# Patient Record
Sex: Male | Born: 1960 | Race: Black or African American | Hispanic: No | Marital: Married | Smoking: Current some day smoker
Health system: Southern US, Community
[De-identification: ages and names within clinical notes are randomized; demographics above are authoritative.]

## PROBLEM LIST (undated history)

## (undated) DIAGNOSIS — I10 Essential (primary) hypertension: Secondary | ICD-10-CM

## (undated) DIAGNOSIS — M109 Gout, unspecified: Secondary | ICD-10-CM

## (undated) HISTORY — PX: SHOULDER SURGERY: SHX246

## (undated) HISTORY — PX: CARPAL TUNNEL RELEASE: SHX101

---

## 1998-06-11 ENCOUNTER — Emergency Department (HOSPITAL_COMMUNITY): Admission: EM | Admit: 1998-06-11 | Discharge: 1998-06-11 | Payer: Self-pay | Admitting: Emergency Medicine

## 2000-09-01 ENCOUNTER — Encounter: Payer: Self-pay | Admitting: Family Medicine

## 2000-09-01 ENCOUNTER — Ambulatory Visit (HOSPITAL_COMMUNITY): Admission: RE | Admit: 2000-09-01 | Discharge: 2000-09-01 | Payer: Self-pay | Admitting: Family Medicine

## 2000-09-07 ENCOUNTER — Ambulatory Visit: Admission: RE | Admit: 2000-09-07 | Discharge: 2000-09-07 | Payer: Self-pay | Admitting: Family Medicine

## 2000-12-18 ENCOUNTER — Emergency Department (HOSPITAL_COMMUNITY): Admission: EM | Admit: 2000-12-18 | Discharge: 2000-12-18 | Payer: Self-pay | Admitting: Emergency Medicine

## 2000-12-18 ENCOUNTER — Encounter: Payer: Self-pay | Admitting: Emergency Medicine

## 2003-11-05 ENCOUNTER — Ambulatory Visit: Payer: Self-pay | Admitting: Family Medicine

## 2003-11-13 ENCOUNTER — Ambulatory Visit: Payer: Self-pay | Admitting: Family Medicine

## 2003-12-13 ENCOUNTER — Ambulatory Visit: Payer: Self-pay | Admitting: Family Medicine

## 2005-06-08 ENCOUNTER — Emergency Department (HOSPITAL_COMMUNITY): Admission: EM | Admit: 2005-06-08 | Discharge: 2005-06-08 | Payer: Self-pay | Admitting: Emergency Medicine

## 2006-02-02 ENCOUNTER — Emergency Department (HOSPITAL_COMMUNITY): Admission: EM | Admit: 2006-02-02 | Discharge: 2006-02-02 | Payer: Self-pay | Admitting: Emergency Medicine

## 2008-07-05 ENCOUNTER — Emergency Department (HOSPITAL_COMMUNITY): Admission: EM | Admit: 2008-07-05 | Discharge: 2008-07-05 | Payer: Self-pay | Admitting: Emergency Medicine

## 2009-05-24 ENCOUNTER — Ambulatory Visit: Payer: Self-pay

## 2011-10-20 ENCOUNTER — Ambulatory Visit: Payer: Self-pay | Admitting: Unknown Physician Specialty

## 2011-10-21 LAB — PATHOLOGY REPORT

## 2012-11-14 ENCOUNTER — Ambulatory Visit: Payer: Self-pay | Admitting: Podiatry

## 2014-04-15 ENCOUNTER — Emergency Department
Admit: 2014-04-15 | Disposition: A | Discharge status: Home / Self Care | Payer: Self-pay | Admitting: Emergency Medicine

## 2014-04-15 LAB — CBC
HCT: 41.4 % (ref 40.0–52.0)
HGB: 13.9 g/dL (ref 13.0–18.0)
MCH: 32.2 pg (ref 26.0–34.0)
MCHC: 33.5 g/dL (ref 32.0–36.0)
MCV: 96 fL (ref 80–100)
Platelet: 209 10*3/uL (ref 150–440)
RBC: 4.31 10*6/uL — ABNORMAL LOW (ref 4.40–5.90)
RDW: 13.5 % (ref 11.5–14.5)
WBC: 7.9 10*3/uL (ref 3.8–10.6)

## 2014-04-15 LAB — BASIC METABOLIC PANEL
Anion Gap: 2 — ABNORMAL LOW (ref 7–16)
BUN: 17 mg/dL
CALCIUM: 8.9 mg/dL
Chloride: 104 mmol/L
Co2: 31 mmol/L
Creatinine: 1.25 mg/dL — ABNORMAL HIGH
EGFR (Non-African Amer.): >60
Glucose: 152 mg/dL — ABNORMAL HIGH
POTASSIUM: 3.4 mmol/L — AB
Sodium: 137 mmol/L

## 2014-04-15 LAB — TROPONIN I: Troponin-I: <0.03 ng/mL

## 2014-07-16 ENCOUNTER — Encounter: Payer: Self-pay | Admitting: Emergency Medicine

## 2014-07-16 ENCOUNTER — Emergency Department
Admission: EM | Admit: 2014-07-16 | Discharge: 2014-07-16 | Disposition: A | Discharge status: Home / Self Care | Payer: BLUE CROSS/BLUE SHIELD | Attending: Emergency Medicine | Admitting: Emergency Medicine

## 2014-07-16 DIAGNOSIS — R319 Hematuria, unspecified: Secondary | ICD-10-CM | POA: Diagnosis present

## 2014-07-16 DIAGNOSIS — Z720 Tobacco use: Secondary | ICD-10-CM | POA: Insufficient documentation

## 2014-07-16 DIAGNOSIS — N390 Urinary tract infection, site not specified: Secondary | ICD-10-CM | POA: Diagnosis not present

## 2014-07-16 DIAGNOSIS — I10 Essential (primary) hypertension: Secondary | ICD-10-CM | POA: Insufficient documentation

## 2014-07-16 HISTORY — DX: Essential (primary) hypertension: I10

## 2014-07-16 HISTORY — DX: Gout, unspecified: M10.9

## 2014-07-16 LAB — URINALYSIS COMPLETE WITH MICROSCOPIC (ARMC ONLY)
BACTERIA UA: NONE SEEN
Bilirubin Urine: NEGATIVE
Glucose, UA: NEGATIVE mg/dL
KETONES UR: NEGATIVE mg/dL
NITRITE: NEGATIVE
Protein, ur: 30 mg/dL — AB
Specific Gravity, Urine: 1.009 (ref 1.005–1.030)
pH: 7 (ref 5.0–8.0)

## 2014-07-16 LAB — CBC
HCT: 40.6 % (ref 40.0–52.0)
Hemoglobin: 13.4 g/dL (ref 13.0–18.0)
MCH: 31.5 pg (ref 26.0–34.0)
MCHC: 33.1 g/dL (ref 32.0–36.0)
MCV: 95.3 fL (ref 80.0–100.0)
PLATELETS: 227 10*3/uL (ref 150–440)
RBC: 4.26 MIL/uL — ABNORMAL LOW (ref 4.40–5.90)
RDW: 13.7 % (ref 11.5–14.5)
WBC: 9.6 10*3/uL (ref 3.8–10.6)

## 2014-07-16 LAB — COMPREHENSIVE METABOLIC PANEL
ALBUMIN: 3.8 g/dL (ref 3.5–5.0)
ALK PHOS: 54 U/L (ref 38–126)
ALT: 20 U/L (ref 17–63)
AST: 19 U/L (ref 15–41)
Anion gap: 10 (ref 5–15)
BILIRUBIN TOTAL: 0.7 mg/dL (ref 0.3–1.2)
BUN: 13 mg/dL (ref 6–20)
CO2: 27 mmol/L (ref 22–32)
CREATININE: 1.1 mg/dL (ref 0.61–1.24)
Calcium: 8.8 mg/dL — ABNORMAL LOW (ref 8.9–10.3)
Chloride: 98 mmol/L — ABNORMAL LOW (ref 101–111)
GFR calc non Af Amer: >60 mL/min (ref >60)
Glucose, Bld: 114 mg/dL — ABNORMAL HIGH (ref 65–99)
Potassium: 3.8 mmol/L (ref 3.5–5.1)
Sodium: 135 mmol/L (ref 135–145)
TOTAL PROTEIN: 7.8 g/dL (ref 6.5–8.1)

## 2014-07-16 MED ORDER — CEPHALEXIN 500 MG PO CAPS
500.0000 mg | ORAL_CAPSULE | Freq: Once | ORAL | Status: AC
  Administered 2014-07-16: 500 mg via ORAL
  Filled 2014-07-16: qty 1

## 2014-07-16 MED ORDER — LEVETIRACETAM 500 MG PO TABS
ORAL_TABLET | ORAL | Status: AC
  Filled 2014-07-16: qty 1

## 2014-07-16 MED ORDER — CEPHALEXIN 500 MG PO CAPS: 500.0000 mg | ORAL_CAPSULE | Freq: Four times a day (QID) | ORAL | Status: AC

## 2014-07-16 MED ORDER — CEPHALEXIN 500 MG PO CAPS
ORAL_CAPSULE | ORAL | Status: AC
  Administered 2014-07-16: 500 mg via ORAL
  Filled 2014-07-16: qty 1

## 2014-07-16 NOTE — ED Provider Notes (Signed)
[facility] Emergency Department Provider Note  Time seen: 3:42 PM  I have reviewed the triage vital signs and the nursing notes.   HISTORY  Chief Complaint Hematuria    HPI Tyrone Patton is a 54 y.o. male with a past medical history of hypertension and gout presents the emergency department with blood in his urine. Patient states last night he noticed a small amount of blood in his urine, it has increased today so the patient came to the emergency department for evaluation. He denies any abdominal pain denies any flank pain. He does note a slight amount of dysuria one beginning and ending urinating. Patient denies ever having bloody urine in the past. Denies fever. Denies nausea or vomiting.     Past Medical History  Diagnosis Date  . Hypertension   . Gout     There are no active problems to display for this patient.   Past Surgical History  Procedure Laterality Date  . Shoulder surgery    . Carpal tunnel release      No current outpatient prescriptions on file.  Allergies Review of patient's allergies indicates no known allergies.  No family history on file.  Social History History  Substance Use Topics  . Smoking status: Current Every Day Smoker    Types: Cigars  . Smokeless tobacco: Not on file  . Alcohol Use: Yes     Comment: daily    Review of Systems Constitutional: Negative for fever. Cardiovascular: Negative for chest pain. Respiratory: Negative for shortness of breath. Gastrointestinal: Negative for abdominal pain, vomiting and diarrhea. Genitourinary: Positive for dysuria. Musculoskeletal: Negative for back pain.  10-point ROS otherwise negative.  ____________________________________________   PHYSICAL EXAM:  VITAL SIGNS: ED Triage Vitals  Enc Vitals Group     BP 07/16/14 1454 153/77 mmHg     Pulse Rate 07/16/14 1454 58     Resp 07/16/14 1454 18     Temp 07/16/14 1454 98.7 F (37.1 C)     Temp Source 07/16/14  1454 Oral     SpO2 07/16/14 1454 95 %     Weight 07/16/14 1454 330 lb (149.687 kg)     Height 07/16/14 1454 5\' 8"  (1.727 m)     Head Cir --      Peak Flow --      Pain Score --      Pain Loc --      Pain Edu? --      Excl. in [place]? --     Constitutional: Alert and oriented. Well appearing and in no distress. ENT   Mouth/Throat: Mucous membranes are moist. Cardiovascular: Normal rate, regular rhythm. No murmur Respiratory: Normal respiratory effort without tachypnea nor retractions. Breath sounds are clear and equal bilaterally. No wheezes/rales/rhonchi. Gastrointestinal: Soft and nontender. No distention.  There is no CVA tenderness. Musculoskeletal: Nontender with normal range of motion in all extremities.  Neurologic:  Normal speech and language. No gross focal neurologic deficits  Skin:  Skin is warm, dry and intact.  Psychiatric: Mood and affect are normal. Speech and behavior are normal.   ____________________________________________    INITIAL IMPRESSION / ASSESSMENT AND PLAN / ED COURSE  Pertinent labs & imaging results that were available during my care of the patient were reviewed by me and considered in my medical decision making (see chart for details).  Patient with hematuria since last night, worse today. Labs have resulted consistent with hematuria, possible infection given white blood cells as well, and dysuria. I  discussed with the patient importance of following up with a urologist provide a follow-up number for the patient. We will place the patient on Keflex, also send a urine culture. Patient is to call Dr. Erlene Quan to arrange a follow-up appointment, and is to return to the emergency department if he develops a fever, nausea/vomiting, or is unable to urinate. Patient agreeable to this plan. We'll discharge patient home at this time.  ____________________________________________   FINAL CLINICAL IMPRESSION(S) / ED DIAGNOSES  Hematuria Urinary tract  infection   Harvest Dark, MD 07/16/14 1544

## 2014-07-16 NOTE — ED Notes (Signed)
Pt states he noticed blood in his urine last night, denies flank or abdominal pain.

## 2014-07-16 NOTE — Discharge Instructions (Signed)
Hematuria Hematuria is blood in your urine. It can be caused by a bladder infection, kidney infection, prostate infection, kidney stone, or cancer of your urinary tract. Infections can usually be treated with medicine, and a kidney stone usually will pass through your urine. If neither of these is the cause of your hematuria, further workup to find out the reason may be needed. It is very important that you tell your health care provider about any blood you see in your urine, even if the blood stops without treatment or happens without causing pain. Blood in your urine that happens and then stops and then happens again can be a symptom of a very serious condition. Also, pain is not a symptom in the initial stages of many urinary cancers. HOME CARE INSTRUCTIONS   Drink lots of fluid, 3-4 quarts a day. If you have been diagnosed with an infection, cranberry juice is especially recommended, in addition to large amounts of water.  Avoid caffeine, tea, and carbonated beverages because they tend to irritate the bladder.  Avoid alcohol because it may irritate the prostate.  Take all medicines as directed by your health care provider.  If you were prescribed an antibiotic medicine, finish it all even if you start to feel better.  If you have been diagnosed with a kidney stone, follow your health care provider's instructions regarding straining your urine to catch the stone.  Empty your bladder often. Avoid holding urine for long periods of time.  After a bowel movement, women should cleanse front to back. Use each tissue only once.  Empty your bladder before and after sexual intercourse if you are a male. SEEK MEDICAL CARE IF:  You develop back pain.  You have a fever.  You have a feeling of sickness in your stomach (nausea) or vomiting.  Your symptoms are not better in 3 days. Return sooner if you are getting worse. SEEK IMMEDIATE MEDICAL CARE IF:   You develop severe vomiting and are  unable to keep the medicine down.  You develop severe back or abdominal pain despite taking your medicines.  You begin passing a large amount of blood or clots in your urine.  You feel extremely weak or faint, or you pass out. MAKE SURE YOU:   Understand these instructions.  Will watch your condition.  Will get help right away if you are not doing well or get worse. Document Released: 12/29/2004 Document Revised: 05/15/2013 Document Reviewed: 08/29/2012 [facility] Patient Information 2015 [place], [place]. This information is not intended to replace advice given to you by your health care provider. Make sure you discuss any questions you have with your health care provider.  Urinary Tract Infection Urinary tract infections (UTIs) can develop anywhere along your urinary tract. Your urinary tract is your body's drainage system for removing wastes and extra water. Your urinary tract includes two kidneys, two ureters, a bladder, and a urethra. Your kidneys are a pair of bean-shaped organs. Each kidney is about the size of your fist. They are located below your ribs, one on each side of your spine. CAUSES Infections are caused by microbes, which are microscopic organisms, including fungi, viruses, and bacteria. These organisms are so small that they can only be seen through a microscope. Bacteria are the microbes that most commonly cause UTIs. SYMPTOMS  Symptoms of UTIs may vary by age and gender of the patient and by the location of the infection. Symptoms in young women typically include a frequent and intense urge to urinate and  a painful, burning feeling in the bladder or urethra during urination. Older women and men are more likely to be tired, shaky, and weak and have muscle aches and abdominal pain. A fever may mean the infection is in your kidneys. Other symptoms of a kidney infection include pain in your back or sides below the ribs, nausea, and vomiting. DIAGNOSIS To diagnose a UTI, your  caregiver will ask you about your symptoms. Your caregiver also will ask to provide a urine sample. The urine sample will be tested for bacteria and white blood cells. White blood cells are made by your body to help fight infection. TREATMENT  Typically, UTIs can be treated with medication. Because most UTIs are caused by a bacterial infection, they usually can be treated with the use of antibiotics. The choice of antibiotic and length of treatment depend on your symptoms and the type of bacteria causing your infection. HOME CARE INSTRUCTIONS  If you were prescribed antibiotics, take them exactly as your caregiver instructs you. Finish the medication even if you feel better after you have only taken some of the medication.  Drink enough water and fluids to keep your urine clear or pale yellow.  Avoid caffeine, tea, and carbonated beverages. They tend to irritate your bladder.  Empty your bladder often. Avoid holding urine for long periods of time.  Empty your bladder before and after sexual intercourse.  After a bowel movement, women should cleanse from front to back. Use each tissue only once. SEEK MEDICAL CARE IF:   You have back pain.  You develop a fever.  Your symptoms do not begin to resolve within 3 days. SEEK IMMEDIATE MEDICAL CARE IF:   You have severe back pain or lower abdominal pain.  You develop chills.  You have nausea or vomiting.  You have continued burning or discomfort with urination. MAKE SURE YOU:   Understand these instructions.  Will watch your condition.  Will get help right away if you are not doing well or get worse. Document Released: 10/08/2004 Document Revised: 06/30/2011 Document Reviewed: 02/06/2011 [facility] Patient Information 2015 [place], [place]. This information is not intended to replace advice given to you by your health care provider. Make sure you discuss any questions you have with your health care provider.

## 2015-07-10 ENCOUNTER — Encounter (HOSPITAL_COMMUNITY): Payer: Self-pay | Admitting: Emergency Medicine

## 2015-07-10 ENCOUNTER — Emergency Department (HOSPITAL_COMMUNITY)
Admission: EM | Admit: 2015-07-10 | Discharge: 2015-07-10 | Disposition: A | Discharge status: Home / Self Care | Payer: BLUE CROSS/BLUE SHIELD | Attending: Emergency Medicine | Admitting: Emergency Medicine

## 2015-07-10 DIAGNOSIS — I10 Essential (primary) hypertension: Secondary | ICD-10-CM | POA: Insufficient documentation

## 2015-07-10 DIAGNOSIS — Y929 Unspecified place or not applicable: Secondary | ICD-10-CM | POA: Insufficient documentation

## 2015-07-10 DIAGNOSIS — W228XXA Striking against or struck by other objects, initial encounter: Secondary | ICD-10-CM | POA: Insufficient documentation

## 2015-07-10 DIAGNOSIS — Y939 Activity, unspecified: Secondary | ICD-10-CM | POA: Insufficient documentation

## 2015-07-10 DIAGNOSIS — R05 Cough: Secondary | ICD-10-CM | POA: Diagnosis not present

## 2015-07-10 DIAGNOSIS — S0121XA Laceration without foreign body of nose, initial encounter: Secondary | ICD-10-CM | POA: Diagnosis not present

## 2015-07-10 DIAGNOSIS — R55 Syncope and collapse: Secondary | ICD-10-CM | POA: Insufficient documentation

## 2015-07-10 DIAGNOSIS — Y990 Civilian activity done for income or pay: Secondary | ICD-10-CM | POA: Diagnosis not present

## 2015-07-10 DIAGNOSIS — R054 Cough syncope: Secondary | ICD-10-CM

## 2015-07-10 DIAGNOSIS — F17210 Nicotine dependence, cigarettes, uncomplicated: Secondary | ICD-10-CM | POA: Insufficient documentation

## 2015-07-10 LAB — I-STAT CHEM 8, ED
BUN: 16 mg/dL (ref 6–20)
CALCIUM ION: 1.11 mmol/L — AB (ref 1.13–1.30)
CHLORIDE: 99 mmol/L — AB (ref 101–111)
Creatinine, Ser: 1.2 mg/dL (ref 0.61–1.24)
Glucose, Bld: 158 mg/dL — ABNORMAL HIGH (ref 65–99)
HCT: 43 % (ref 39.0–52.0)
Hemoglobin: 14.6 g/dL (ref 13.0–17.0)
Potassium: 4.3 mmol/L (ref 3.5–5.1)
SODIUM: 138 mmol/L (ref 135–145)
TCO2: 28 mmol/L (ref 0–100)

## 2015-07-10 NOTE — ED Notes (Signed)
Pt arrives via EMS from workplace where pt states was smoking a cigar, began coughing and fell to the ground on the concrete. Pt with abrasions to face, tongue lac. Pt awake, alert, oriented x4, VSS.

## 2015-07-10 NOTE — Discharge Instructions (Signed)
Tissue Adhesive Wound Care Some cuts, wounds, lacerations, and incisions can be repaired by using tissue adhesive. Tissue adhesive is like glue. It holds the skin together, allowing for faster healing. It forms a strong bond on the skin in about 1 minute and reaches its full strength in about 2 or 3 minutes. The adhesive disappears naturally while the wound is healing. It is important to take proper care of your wound at home while it heals.  HOME CARE INSTRUCTIONS   Showers are allowed. Do not soak the area containing the tissue adhesive. Do not take baths, swim, or use hot tubs. Do not use any soaps or ointments on the wound. Certain ointments can weaken the glue.  If a bandage (dressing) has been applied, follow your health care provider's instructions for how often to change the dressing.   Keep the dressing dry if one has been applied.   Do not scratch, pick, or rub the adhesive.   Do not place tape over the adhesive. The adhesive could come off when pulling the tape off.   Protect the wound from further injury until it is healed.   Protect the wound from sun and tanning bed exposure while it is healing and for several weeks after healing.   Only take over-the-counter or prescription medicines as directed by your health care provider.   Keep all follow-up appointments as directed by your health care provider. SEEK IMMEDIATE MEDICAL CARE IF:   Your wound becomes red, swollen, hot, or tender.   You develop a rash after the glue is applied.  You have increasing pain in the wound.   You have a red streak that goes away from the wound.   You have pus coming from the wound.   You have increased bleeding.  You have a fever.  You have shaking chills.   You notice a bad smell coming from the wound.   Your wound or adhesive breaks open.  MAKE SURE YOU:   Understand these instructions.  Will watch your condition.  Will get help right away if you are not doing  well or get worse.   This information is not intended to replace advice given to you by your health care provider. Make sure you discuss any questions you have with your health care provider.   Document Released: 06/24/2000 Document Revised: 10/19/2012 Document Reviewed: 07/20/2012 Elsevier Interactive Patient Education [employer].

## 2015-07-10 NOTE — ED Provider Notes (Signed)
CSN: [id number]     Arrival date & time 07/10/15  0800 History   First MD Initiated Contact with Patient 07/10/15 0801     Chief Complaint  Patient presents with  . Fall   HPI The patient was outside smoking a cigar. A coworker told to a joke and the patient began coughing uncontrollably.  The patient subsequently had a brief fainting spell. He does not think he lost consciousness but he may have for a second. Patient fell forward striking his face on a garbage can. He sustained some abrasions to his face and a small laceration to his nose. The patient also bit his tongue. He denies any chest pain or shortness of breath. Right now he feels fine. Denies any numbness or weakness. No headache or neck pain. He does not have any history of heart disease or prior fainting spells. Past Medical History  Diagnosis Date  . Hypertension   . Gout    Past Surgical History  Procedure Laterality Date  . Shoulder surgery    . Carpal tunnel release     History reviewed. No pertinent family history. Social History  Substance Use Topics  . Smoking status: Current Every Day Smoker    Types: Cigars  . Smokeless tobacco: None  . Alcohol Use: Yes     Comment: daily    Review of Systems  All other systems reviewed and are negative.     Allergies  Review of patient's allergies indicates no known allergies.  Home Medications   Prior to Admission medications   Not on File   BP 146/64 mmHg  Pulse 69  Temp(Src) 97.9 F (36.6 C) (Oral)  Resp 16  SpO2 98% Physical Exam  Constitutional: He appears well-developed and well-nourished. No distress.  HENT:  Head: Normocephalic.  Right Ear: External ear normal.  Left Ear: External ear normal.  Superficial abrasions on his forehead and face, possibly 1 cm irregular laceration superior aspect of the bridge of his nose  Eyes: Conjunctivae are normal. Right eye exhibits no discharge. Left eye exhibits no discharge. No scleral icterus.  Neck: Neck  supple. No tracheal deviation present.  Cardiovascular: Normal rate, regular rhythm and intact distal pulses.   Pulmonary/Chest: Effort normal and breath sounds normal. No stridor. No respiratory distress. He has no wheezes. He has no rales.  Abdominal: Soft. Bowel sounds are normal. He exhibits no distension. There is no tenderness. There is no rebound and no guarding.  Musculoskeletal: He exhibits no edema or tenderness.  No tenderness to palpation in the cervical, thoracic or lumbar spine, no tenderness to palpation in his upper or lower extremities  Neurological: He is alert. He has normal strength. No cranial nerve deficit (no facial droop, extraocular movements intact, no slurred speech) or sensory deficit. He exhibits normal muscle tone. He displays no seizure activity. Coordination normal.  Skin: Skin is warm and dry. No rash noted.  Psychiatric: He has a normal mood and affect.  Nursing note and vitals reviewed.   ED Course  .Marland KitchenLaceration Repair Date/Time: 07/10/2015 8:38 AM Performed by: Dorie Rank Authorized by: Dorie Rank Consent: Verbal consent obtained. Risks and benefits: risks, benefits and alternatives were discussed Consent given by: patient Body area: head/neck Location details: nose Laceration length: 1 cm Tendon involvement: none Nerve involvement: none Vascular damage: no Patient sedated: no Preparation: Patient was prepped and draped in the usual sterile fashion. Irrigation solution: saline Irrigation method: jet lavage Amount of cleaning: standard Debridement: none Degree of undermining: none Skin  closure: glue   (including critical care time) Labs Review Labs Reviewed  I-STAT CHEM 8, ED - Abnormal; Notable for the following:    Chloride 99 (*)    Glucose, Bld 158 (*)    Calcium, Ion 1.11 (*)    All other components within normal limits    I have personally reviewed and evaluated these images and lab results as part of my medical decision-making.    EKG Interpretation   Date/Time:  Wednesday July 10 2015 08:14:57 EDT Ventricular Rate:  76 PR Interval:    QRS Duration: 111 QT Interval:  398 QTC Calculation: 448 R Axis:   -2 Text Interpretation:  Sinus rhythm No old tracing to compare Confirmed by  Aleisha Paone  MD-J, Blenda Wisecup [id number]) on 07/10/2015 8:16:43 AM      MDM   Final diagnoses:  Cough syncope  Laceration of nose, initial encounter    Syncopal episode related to a coughing spell.  Pt is feeling fine now without complaints. EKG and istat are reassuring.  Minor laceration to nose repaired with dermabond.     Dorie Rank, MD 07/10/15 [phone]

## 2015-12-19 ENCOUNTER — Emergency Department
Admission: EM | Admit: 2015-12-19 | Discharge: 2015-12-19 | Disposition: A | Discharge status: Home / Self Care | Payer: BLUE CROSS/BLUE SHIELD | Attending: Emergency Medicine | Admitting: Emergency Medicine

## 2015-12-19 ENCOUNTER — Encounter: Payer: Self-pay | Admitting: Emergency Medicine

## 2015-12-19 ENCOUNTER — Emergency Department: Payer: BLUE CROSS/BLUE SHIELD

## 2015-12-19 DIAGNOSIS — L03116 Cellulitis of left lower limb: Secondary | ICD-10-CM | POA: Insufficient documentation

## 2015-12-19 DIAGNOSIS — M7989 Other specified soft tissue disorders: Secondary | ICD-10-CM | POA: Diagnosis present

## 2015-12-19 DIAGNOSIS — F17290 Nicotine dependence, other tobacco product, uncomplicated: Secondary | ICD-10-CM | POA: Insufficient documentation

## 2015-12-19 DIAGNOSIS — I10 Essential (primary) hypertension: Secondary | ICD-10-CM | POA: Diagnosis not present

## 2015-12-19 LAB — COMPREHENSIVE METABOLIC PANEL
ALBUMIN: 4.3 g/dL (ref 3.5–5.0)
ALT: 20 U/L (ref 17–63)
ANION GAP: 9 (ref 5–15)
AST: 17 U/L (ref 15–41)
Alkaline Phosphatase: 57 U/L (ref 38–126)
BUN: 12 mg/dL (ref 6–20)
CHLORIDE: 98 mmol/L — AB (ref 101–111)
CO2: 27 mmol/L (ref 22–32)
Calcium: 9.4 mg/dL (ref 8.9–10.3)
Creatinine, Ser: 1.11 mg/dL (ref 0.61–1.24)
GFR calc Af Amer: >60 mL/min (ref >60)
GFR calc non Af Amer: >60 mL/min (ref >60)
GLUCOSE: 123 mg/dL — AB (ref 65–99)
POTASSIUM: 4 mmol/L (ref 3.5–5.1)
SODIUM: 134 mmol/L — AB (ref 135–145)
Total Bilirubin: 1.3 mg/dL — ABNORMAL HIGH (ref 0.3–1.2)
Total Protein: 8.7 g/dL — ABNORMAL HIGH (ref 6.5–8.1)

## 2015-12-19 LAB — CBC
HCT: 43.3 % (ref 40.0–52.0)
Hemoglobin: 14.8 g/dL (ref 13.0–18.0)
MCH: 32.6 pg (ref 26.0–34.0)
MCHC: 34.1 g/dL (ref 32.0–36.0)
MCV: 95.4 fL (ref 80.0–100.0)
PLATELETS: 242 10*3/uL (ref 150–440)
RBC: 4.53 MIL/uL (ref 4.40–5.90)
RDW: 13.7 % (ref 11.5–14.5)
WBC: 15.9 10*3/uL — AB (ref 3.8–10.6)

## 2015-12-19 MED ORDER — CLINDAMYCIN HCL 150 MG PO CAPS: 450.0000 mg | ORAL_CAPSULE | Freq: Three times a day (TID) | ORAL | 0 refills | Status: AC

## 2015-12-19 MED ORDER — ACETAMINOPHEN 500 MG PO TABS
1000.0000 mg | ORAL_TABLET | Freq: Once | ORAL | Status: AC
  Administered 2015-12-19: 1000 mg via ORAL

## 2015-12-19 MED ORDER — CLINDAMYCIN PHOSPHATE 600 MG/50ML IV SOLN
600.0000 mg | Freq: Once | INTRAVENOUS | Status: AC
  Administered 2015-12-19: 600 mg via INTRAVENOUS

## 2015-12-19 NOTE — ED Triage Notes (Addendum)
Pt to ED c/o left thigh pain and swelling, thinks something bit him but unsure.  Pt with tenderness, redness, pain, warmth and swelling from left groin to posterior left knee.  Pt denies SOB, fevers at home.

## 2015-12-19 NOTE — Discharge Instructions (Signed)
Please take the entire course of antibiotics, even if you're feeling better. Please keep your leg elevated above the level of your heart as much as possible. He may also apply ice to the inner thigh of your left leg for 10 minutes every 2 hours. He may take Motrin or Tylenol for pain, please be cautious with Motrin uses it can cause kidney dysfunction.  Return to the emergency department for severe pain, spread of your redness or swelling, fever, vomiting, or any other symptoms concerning to you.

## 2015-12-19 NOTE — ED Provider Notes (Signed)
[facility] Emergency Department Provider Note  ____________________________________________  Time seen: Approximately 2:58 PM  I have reviewed the triage vital signs and the nursing notes.   HISTORY  Chief Complaint Leg Swelling and Insect Bite    HPI Tyrone Patton is a 55 y.o. male , nondiabetic, presenting with left inner thigh swelling, erythema and warmth, with tenderness. The patient reports that since yesterday he has had progressive symptoms. He has not had any systemic symptoms including fever, chills, nausea or vomiting. No chest pain or shortness of breath.   Past Medical History:  Diagnosis Date  . Gout   . Hypertension     There are no active problems to display for this patient.   Past Surgical History:  Procedure Laterality Date  . CARPAL TUNNEL RELEASE    . SHOULDER SURGERY        Allergies Patient has no known allergies.  History reviewed. No pertinent family history.  Social History Social History  Substance Use Topics  . Smoking status: Current Every Day Smoker    Types: Cigars  . Smokeless tobacco: Never Used  . Alcohol use 6.0 oz/week    5 Cans of beer, 5 Shots of liquor per week     Comment: daily    Review of Systems Constitutional: No fever/chills.No lightheadedness or syncope. Eyes: No visual changes. ENT: No sore throat. No congestion or rhinorrhea. Cardiovascular: Denies chest pain. Denies palpitations. Respiratory: Denies shortness of breath.  No cough. Gastrointestinal: No abdominal pain.  No nausea, no vomiting.  No diarrhea.  No constipation. Genitourinary: Negative for dysuria. Musculoskeletal: Negative for back pain. Skin: Positive for erythema, warmth, tenderness, and swelling on the left medial thigh. Neurological: Negative for headaches. No focal numbness, tingling or weakness.   10-point ROS otherwise negative.  ____________________________________________   PHYSICAL EXAM:  VITAL  SIGNS: ED Triage Vitals [12/19/15 1402]  Enc Vitals Group     BP 137/70     Pulse Rate 79     Resp      Temp 99.5 F (37.5 C)     Temp Source Oral     SpO2 96 %     Weight (!) 331 lb (150.1 kg)     Height 5\' 8"  (1.727 m)     Head Circumference      Peak Flow      Pain Score 10     Pain Loc      Pain Edu?      Excl. in [place]?     Constitutional: Alert and oriented. Appears older than stated age but in no acute distress. Answers questions appropriately. Eyes: Conjunctivae are normal.  EOMI. No scleral icterus. Head: Atraumatic. Nose: No congestion/rhinnorhea. Mouth/Throat: Mucous membranes are moist.  Neck: No stridor.  Supple.   Cardiovascular: Normal rate Respiratory: Normal respiratory effort.  No accessory muscle use or retractions. Gastrointestinal: Obese. Soft, nontender and nondistended.  No guarding or rebound.  No peritoneal signs. Musculoskeletal: Full range of motion of the left ankle, knee, hip without pain. Neurologic:  A&Ox3.  Speech is clear.  Face and smile are symmetric.  EOMI.  Moves all extremities well. Skin:  Large area of erythema that extends from the mid calf to the middle of the thigh on the medial aspect of the left inner leg with overlying erythema and warmth. No fluctuance. No skin break. Psychiatric: Mood and affect are normal. Speech and behavior are normal.  Normal judgement.  ____________________________________________   LABS (all labs ordered are listed,  but only abnormal results are displayed)  Labs Reviewed  CBC - Abnormal; Notable for the following:       Result Value   WBC 15.9 (*)    All other components within normal limits  COMPREHENSIVE METABOLIC PANEL - Abnormal; Notable for the following:    Sodium 134 (*)    Chloride 98 (*)    Glucose, Bld 123 (*)    Total Protein 8.7 (*)    Total Bilirubin 1.3 (*)    All other components within normal limits   ____________________________________________  EKG  Not  indicated ____________________________________________  RADIOLOGY  US Venous Img Lower Unilateral Left  Result Date: 12/19/2015 CLINICAL DATA:  Left lower extremity swelling for 2 days. Redness and warmth in the medial left thigh. EXAM: LEFT LOWER EXTREMITY VENOUS DOPPLER ULTRASOUND TECHNIQUE: Gray-scale sonography with graded compression, as well as color Doppler and duplex ultrasound, were performed to evaluate the deep venous system from the level of the common femoral vein through the popliteal and proximal calf veins. Spectral Doppler was utilized to evaluate flow at rest and with distal augmentation maneuvers. COMPARISON:  None. FINDINGS: Right common femoral vein is patent without thrombus. Normal compressibility, augmentation and color Doppler flow in the left common femoral vein, left femoral vein and left popliteal vein. The left saphenofemoral junction is patent. Left profunda femoral vein is patent without thrombus. Visualized left deep calf veins are patent without thrombus. Prominent elongated lymph node in left groin measuring 0.9 cm in the short axis. Images were obtained in the medial left thigh at the area of pain and redness. There is a compressible superficial vein in this area which probably represents the left great saphenous vein. IMPRESSION: Negative for deep venous thrombosis in left lower extremity. Electronically Signed   By: Markus Daft M.D.   On: 12/19/2015 16:37    ____________________________________________   PROCEDURES  Procedure(s) performed: None  Procedures  Critical Care performed: No ____________________________________________   INITIAL IMPRESSION / ASSESSMENT AND PLAN / ED COURSE  Pertinent labs & imaging results that were available during my care of the patient were reviewed by me and considered in my medical decision making (see chart for details).  55 y.o. male, nondiabetic, presenting with left inner thigh erythema, swelling, warmth, and  tenderness. Overall, the patient's physical exam findings are most consistent with cellulitis. I will plan to initiate clindamycin, for one dose intravenously, and discharge him home with oral clindamycin. He is not having any systemic symptoms today and even though his white blood cell count is mildly elevated, he is not diabetic so admission for intravenous antibiotics is not warranted at this time. While the patient is here, I will get a ultrasound to rule out DVT. I have had a long discussion with the patient about monitoring his rash, and we'll demarcate the rash with a skin pen today. He understands return precautions as well is follow-up instructions.  ----------------------------------------- 5:08 PM on 12/19/2015 -----------------------------------------  The patient's ultrasound is negative for DVT. He has received antibiotics and will be discharged home with oral antibiotics and PMD follow-up tomorrow. [facility]'s return precautions as well as INSTRUCTIONS. ____________________________________________  FINAL CLINICAL IMPRESSION(S) / ED DIAGNOSES  Final diagnoses:  Left leg cellulitis    Clinical Course       NEW MEDICATIONS STARTED DURING THIS VISIT:  New Prescriptions   CLINDAMYCIN (CLEOCIN) 150 MG CAPSULE    Take 3 capsules (450 mg total) by mouth 3 (three) times daily.      Anne-Caroline  Mariea Clonts, MD 12/19/15 [phone]

## 2016-05-19 ENCOUNTER — Emergency Department: Payer: BLUE CROSS/BLUE SHIELD

## 2016-05-19 ENCOUNTER — Emergency Department
Admission: EM | Admit: 2016-05-19 | Discharge: 2016-05-19 | Disposition: A | Discharge status: Home / Self Care | Payer: BLUE CROSS/BLUE SHIELD | Attending: Emergency Medicine | Admitting: Emergency Medicine

## 2016-05-19 ENCOUNTER — Encounter: Payer: Self-pay | Admitting: Emergency Medicine

## 2016-05-19 DIAGNOSIS — M7989 Other specified soft tissue disorders: Secondary | ICD-10-CM | POA: Diagnosis present

## 2016-05-19 DIAGNOSIS — S90822A Blister (nonthermal), left foot, initial encounter: Secondary | ICD-10-CM | POA: Diagnosis not present

## 2016-05-19 DIAGNOSIS — Y939 Activity, unspecified: Secondary | ICD-10-CM | POA: Insufficient documentation

## 2016-05-19 DIAGNOSIS — Z79899 Other long term (current) drug therapy: Secondary | ICD-10-CM | POA: Insufficient documentation

## 2016-05-19 DIAGNOSIS — X58XXXA Exposure to other specified factors, initial encounter: Secondary | ICD-10-CM | POA: Insufficient documentation

## 2016-05-19 DIAGNOSIS — Y999 Unspecified external cause status: Secondary | ICD-10-CM | POA: Insufficient documentation

## 2016-05-19 DIAGNOSIS — I10 Essential (primary) hypertension: Secondary | ICD-10-CM | POA: Diagnosis not present

## 2016-05-19 DIAGNOSIS — F17290 Nicotine dependence, other tobacco product, uncomplicated: Secondary | ICD-10-CM | POA: Diagnosis not present

## 2016-05-19 DIAGNOSIS — Y929 Unspecified place or not applicable: Secondary | ICD-10-CM | POA: Insufficient documentation

## 2016-05-19 MED ORDER — LIDOCAINE-EPINEPHRINE-TETRACAINE (LET) SOLUTION
NASAL | Status: AC
  Filled 2016-05-19: qty 3

## 2016-05-19 NOTE — ED Triage Notes (Signed)
C/O calluses to bottom of foot for a while.  Seen and treated through [facility] for same, but symptoms not improving.

## 2016-05-19 NOTE — ED Triage Notes (Signed)
FIRST NURSE: pt here with sore to bottom of foot since Friday and is getting worse. Pt ambulated to stat desk with no difficulties noted.

## 2016-05-19 NOTE — ED Provider Notes (Signed)
[facility] Emergency Department Provider Note   ____________________________________________    I have reviewed the triage vital signs and the nursing notes.   HISTORY  Chief Complaint Foot Pain     HPI Tyrone Patton is a 56 y.o. male who presents with complaints of moderate aching pain and swelling to the bottom of his left foot. Patient reports approximately 5-6 days of pain. Initially it started as a small blister, saw urgent care and they recommended no treatment, however he reports it is continually enlarged and become more painful. He denies redness or discharge. No fevers. Denies foreign bodies. No history of diabetes.   Past Medical History:  Diagnosis Date  . Gout   . Hypertension     There are no active problems to display for this patient.   Past Surgical History:  Procedure Laterality Date  . CARPAL TUNNEL RELEASE    . SHOULDER SURGERY      Prior to Admission medications   Medication Sig Start Date End Date Taking? Authorizing Provider  allopurinol (ZYLOPRIM) 300 MG tablet Take 300 mg by mouth daily.   Yes [provider]  atenolol (TENORMIN) 50 MG tablet Take 50 mg by mouth daily.   Yes [provider]  colchicine 0.6 MG tablet Take 0.6 mg by mouth daily.   Yes [provider]  doxycycline (VIBRAMYCIN) 100 MG capsule Take 100 mg by mouth 2 (two) times daily.   Yes [provider]  esomeprazole (NEXIUM) 40 MG capsule Take 40 mg by mouth daily at 12 noon.   Yes [provider]  losartan (COZAAR) 100 MG tablet Take 100 mg by mouth daily.   Yes [provider]  naproxen (NAPROSYN) 500 MG tablet Take 500 mg by mouth 2 (two) times daily with a meal.   Yes [provider]     Allergies Patient has no known allergies.  No family history on file.  Social History Social History  Substance Use Topics  . Smoking status: Current Every Day Smoker    Types: Cigars  .  Smokeless tobacco: Never Used  . Alcohol use 6.0 oz/week    5 Cans of beer, 5 Shots of liquor per week     Comment: daily    Review of Systems  Constitutional: No fever/chills      Musculoskeletal: Foot pain as above Skin: Blister to the bottom of the foot Neurological: Negative for numbness    ____________________________________________   PHYSICAL EXAM:  VITAL SIGNS: ED Triage Vitals  Enc Vitals Group     BP --      Pulse Rate 05/19/16 0859 69     Resp 05/19/16 0859 20     Temp 05/19/16 0859 98.3 F (36.8 C)     Temp Source 05/19/16 0859 Oral     SpO2 05/19/16 0859 96 %     Weight 05/19/16 0900 (!) 334 lb (151.5 kg)     Height 05/19/16 0900 5\' 8"  (1.727 m)     Head Circumference --      Peak Flow --      Pain Score 05/19/16 0906 9     Pain Loc --      Pain Edu? --      Excl. in [place]? --     Constitutional: Alert and oriented. No acute distress. Pleasant and interactive   Cardiovascular: Normal rate, regular rhythm.  Respiratory: Normal respiratory effort.  No retractions. Genitourinary: deferred Musculoskeletal: Patient with 3 blisters to the  dorsal aspect of the left forefoot which appear to be connected, no surrounding erythema, fluctuance, no discharge, mild tenderness to palpation Neurologic:  Normal speech and language. No gross focal neurologic deficits are appreciated.   Skin:  Skin is warm, dry and intact.   ____________________________________________   LABS (all labs ordered are listed, but only abnormal results are displayed)  Labs Reviewed - No data to display ____________________________________________  EKG   ____________________________________________  RADIOLOGY  X-ray negative for foreign body ____________________________________________   PROCEDURES  Procedure(s) performed: yes  INCISION AND DRAINAGE Performed by: Lavonia Drafts Consent: Verbal consent obtained. Risks and benefits: risks, benefits and alternatives  were discussed Type: abscess  Body area: left foot  Anesthesia: local infiltration  Incision was made with a scalpel.  Local anesthetic: LET    Complexity: simple  Drainage: clear  Drainage amount: 8 cc    Patient tolerance: Patient tolerated the procedure well with no immediate complications.       Critical Care performed: No ____________________________________________   INITIAL IMPRESSION / ASSESSMENT AND PLAN / ED COURSE  Pertinent labs & imaging results that were available during my care of the patient were reviewed by me and considered in my medical decision making (see chart for details).  Patient with worsening fluctuant painful areas to the left forefoot, there appeared to be blisters however he reports worsening pain over the last several days making it difficult for him to walk. I&D performed with serosanguineous fluid, no evidence of infection. Recommended continued supportive care daily dressing changes and checks to ensure no infection. Return precautions discussed.   ____________________________________________   FINAL CLINICAL IMPRESSION(S) / ED DIAGNOSES  Final diagnoses:  Blister of left foot without infection, initial encounter      NEW MEDICATIONS STARTED DURING THIS VISIT:  Discharge Medication List as of 05/19/2016 11:48 AM       Note:  This document was prepared using Dragon voice recognition software and may include unintentional dictation errors.    Lavonia Drafts, MD 05/19/16 1249

## 2017-04-16 ENCOUNTER — Ambulatory Visit
Admission: RE | Admit: 2017-04-16 | Discharge: 2017-04-16 | Disposition: A | Discharge status: Home / Self Care | Payer: Commercial Managed Care - PPO | Source: Ambulatory Visit | Attending: Family Medicine | Admitting: Family Medicine

## 2017-04-16 ENCOUNTER — Other Ambulatory Visit: Payer: Self-pay | Admitting: Family Medicine

## 2017-04-16 DIAGNOSIS — M79662 Pain in left lower leg: Secondary | ICD-10-CM | POA: Diagnosis not present

## 2017-04-16 DIAGNOSIS — M79605 Pain in left leg: Secondary | ICD-10-CM

## 2017-04-29 DIAGNOSIS — D649 Anemia, unspecified: Secondary | ICD-10-CM | POA: Diagnosis not present

## 2017-04-29 DIAGNOSIS — M79605 Pain in left leg: Secondary | ICD-10-CM | POA: Diagnosis not present

## 2017-05-20 DIAGNOSIS — M25562 Pain in left knee: Secondary | ICD-10-CM | POA: Diagnosis not present

## 2017-05-20 DIAGNOSIS — M79605 Pain in left leg: Secondary | ICD-10-CM | POA: Diagnosis not present

## 2017-05-20 DIAGNOSIS — M7989 Other specified soft tissue disorders: Secondary | ICD-10-CM | POA: Diagnosis not present

## 2017-07-09 DIAGNOSIS — Z13220 Encounter for screening for lipoid disorders: Secondary | ICD-10-CM | POA: Diagnosis not present

## 2017-07-09 DIAGNOSIS — M109 Gout, unspecified: Secondary | ICD-10-CM | POA: Diagnosis not present

## 2017-07-09 DIAGNOSIS — I10 Essential (primary) hypertension: Secondary | ICD-10-CM | POA: Diagnosis not present

## 2017-12-20 DIAGNOSIS — Z125 Encounter for screening for malignant neoplasm of prostate: Secondary | ICD-10-CM | POA: Diagnosis not present

## 2017-12-20 DIAGNOSIS — Z23 Encounter for immunization: Secondary | ICD-10-CM | POA: Diagnosis not present

## 2017-12-20 DIAGNOSIS — Z13220 Encounter for screening for lipoid disorders: Secondary | ICD-10-CM | POA: Diagnosis not present

## 2017-12-20 DIAGNOSIS — M7989 Other specified soft tissue disorders: Secondary | ICD-10-CM | POA: Diagnosis not present

## 2017-12-20 DIAGNOSIS — I10 Essential (primary) hypertension: Secondary | ICD-10-CM | POA: Diagnosis not present

## 2017-12-20 DIAGNOSIS — M109 Gout, unspecified: Secondary | ICD-10-CM | POA: Diagnosis not present

## 2017-12-22 ENCOUNTER — Other Ambulatory Visit: Payer: Self-pay | Admitting: Family Medicine

## 2017-12-22 DIAGNOSIS — R2242 Localized swelling, mass and lump, left lower limb: Secondary | ICD-10-CM

## 2017-12-22 DIAGNOSIS — R609 Edema, unspecified: Secondary | ICD-10-CM

## 2017-12-22 DIAGNOSIS — M79606 Pain in leg, unspecified: Secondary | ICD-10-CM

## 2017-12-24 ENCOUNTER — Ambulatory Visit: Payer: Commercial Managed Care - PPO

## 2018-01-20 DIAGNOSIS — J209 Acute bronchitis, unspecified: Secondary | ICD-10-CM | POA: Diagnosis not present

## 2018-02-28 DIAGNOSIS — Z0000 Encounter for general adult medical examination without abnormal findings: Secondary | ICD-10-CM | POA: Diagnosis not present

## 2018-03-09 DIAGNOSIS — J069 Acute upper respiratory infection, unspecified: Secondary | ICD-10-CM | POA: Diagnosis not present

## 2018-04-01 DIAGNOSIS — R39198 Other difficulties with micturition: Secondary | ICD-10-CM | POA: Diagnosis not present

## 2018-04-06 DIAGNOSIS — R39198 Other difficulties with micturition: Secondary | ICD-10-CM | POA: Diagnosis not present

## 2018-07-19 IMAGING — US US EXTREM LOW VENOUS*L*
1 series · 13 of 24 positions shown · non-contrast
Comparison: None.

CLINICAL DATA: Left lower extremity pain.



[Series 1: us extrem low venous*left* · 0.09mm/px · 13 of 40 slices shown]
[im 1/40]
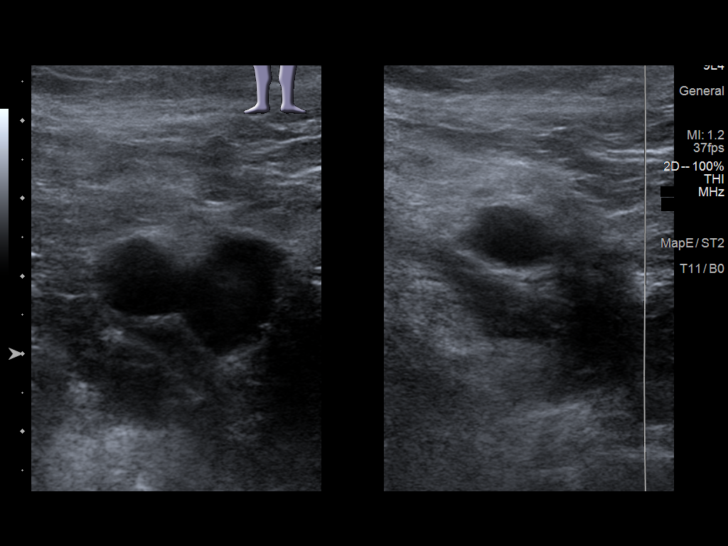
[im 4/40]
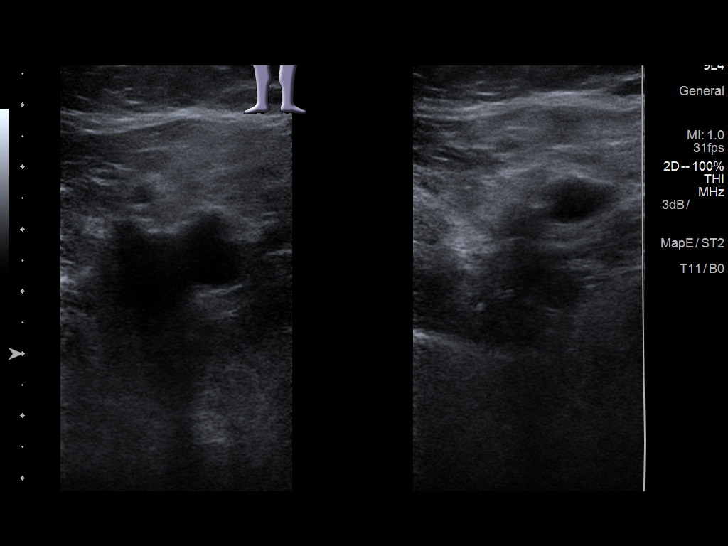
[im 7/40]
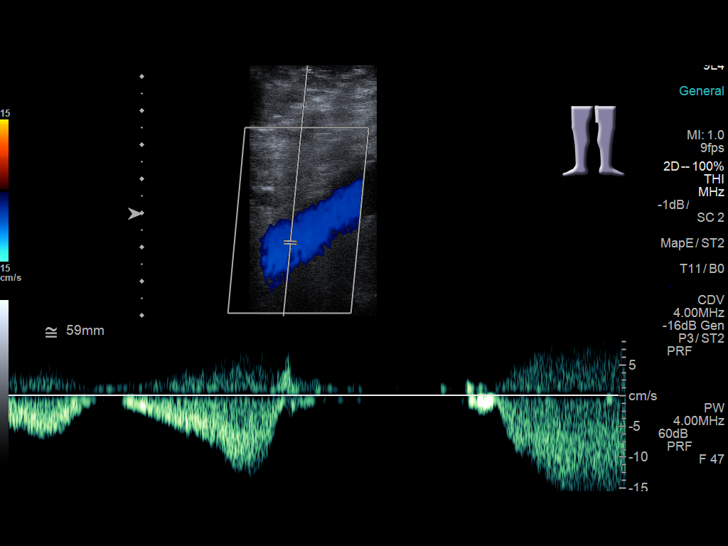
[im 11/40]
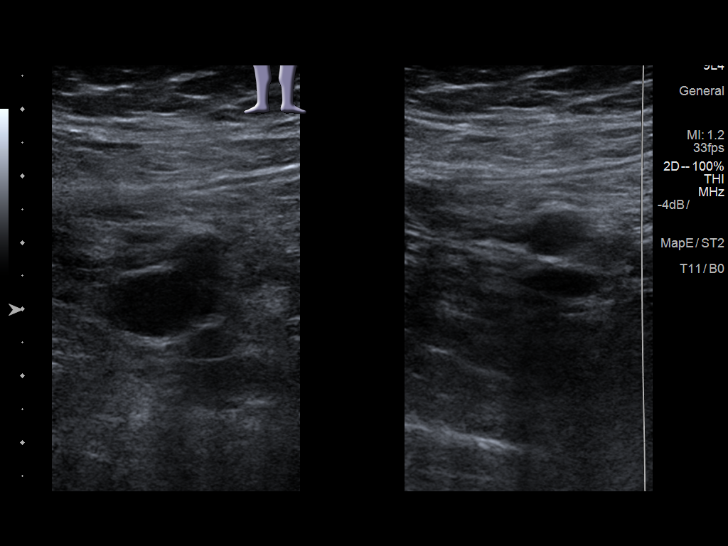
[im 14/40]
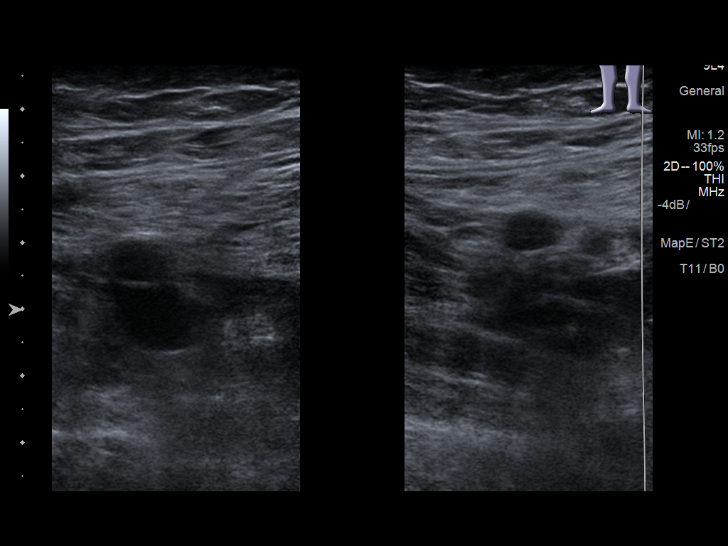
[im 17/40]
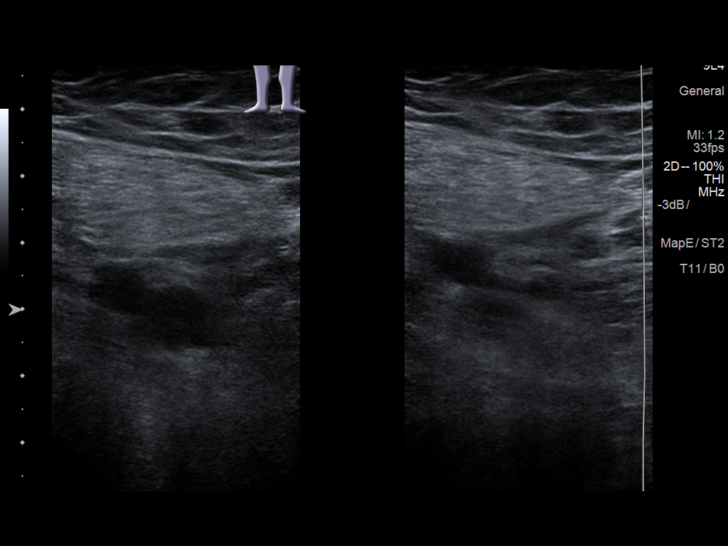
[im 21/40]
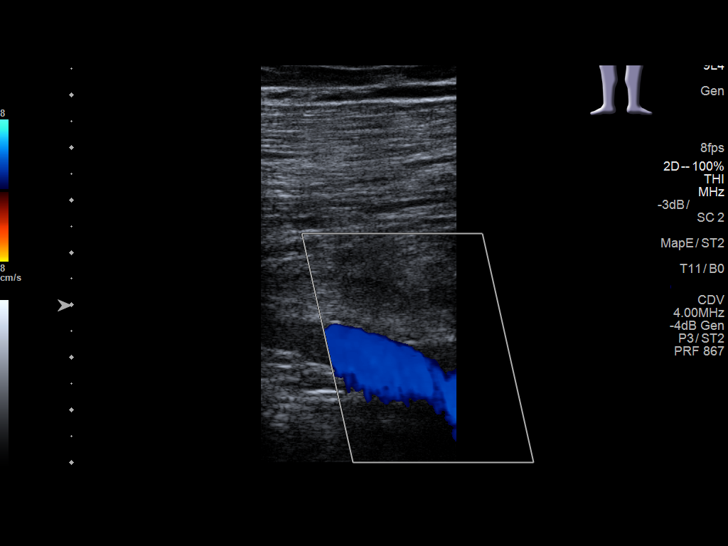
[im 23/40]
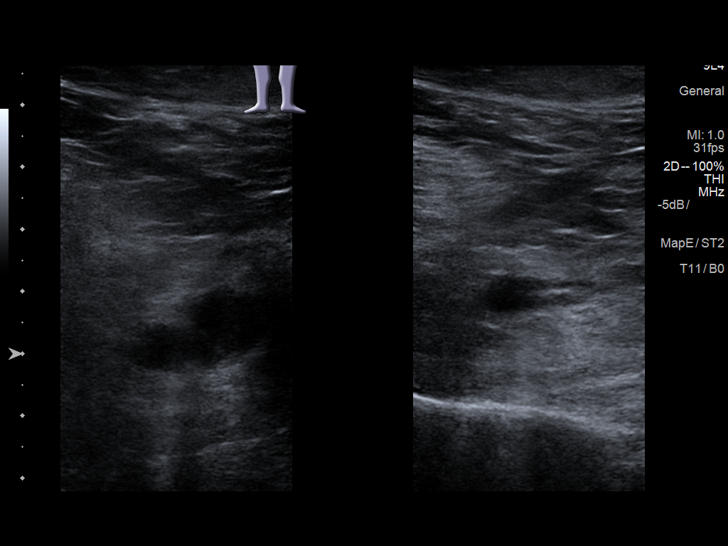
[im 26/40]
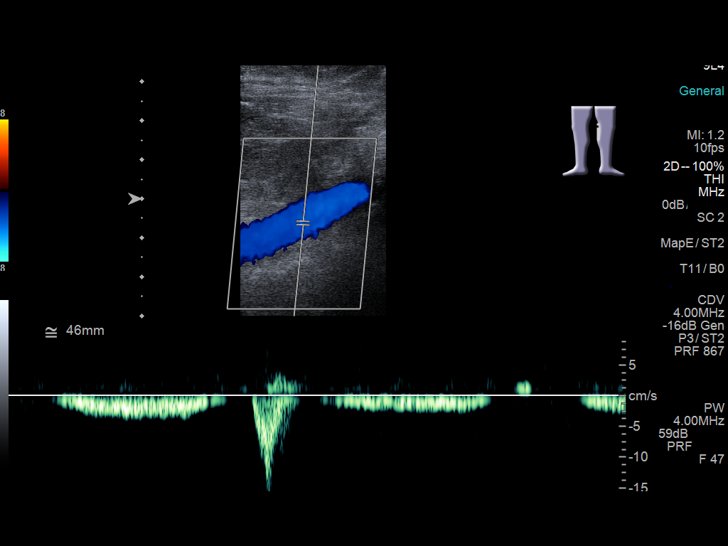
[im 29/40]
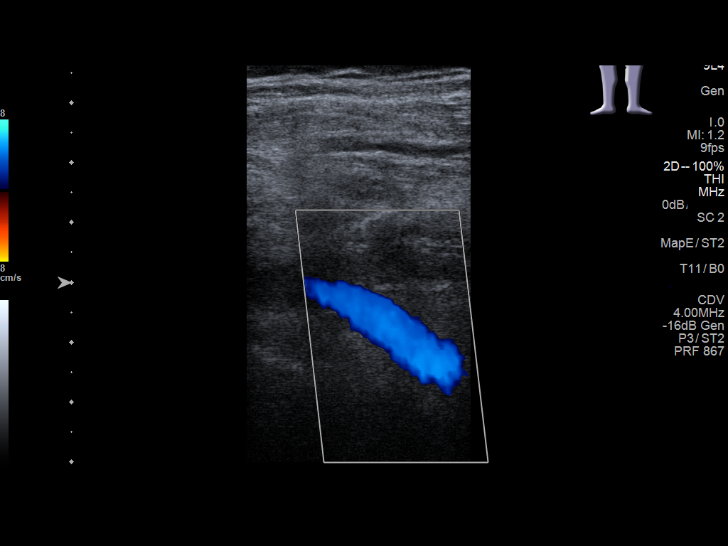
[im 33/40]
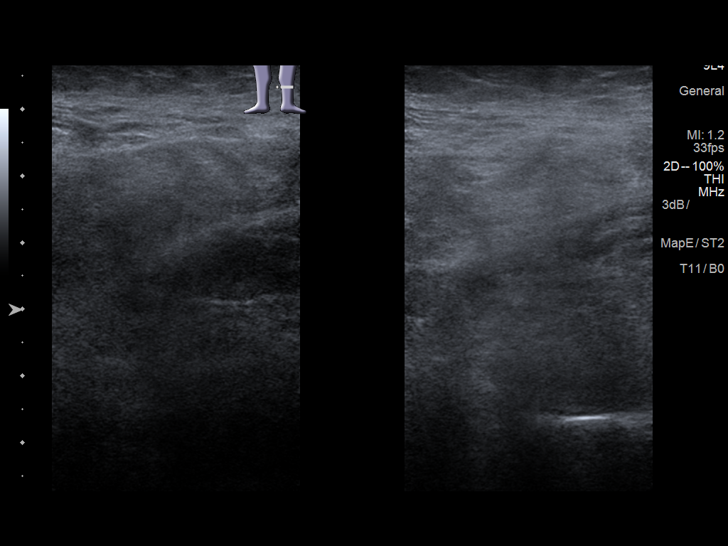
[im 36/40]
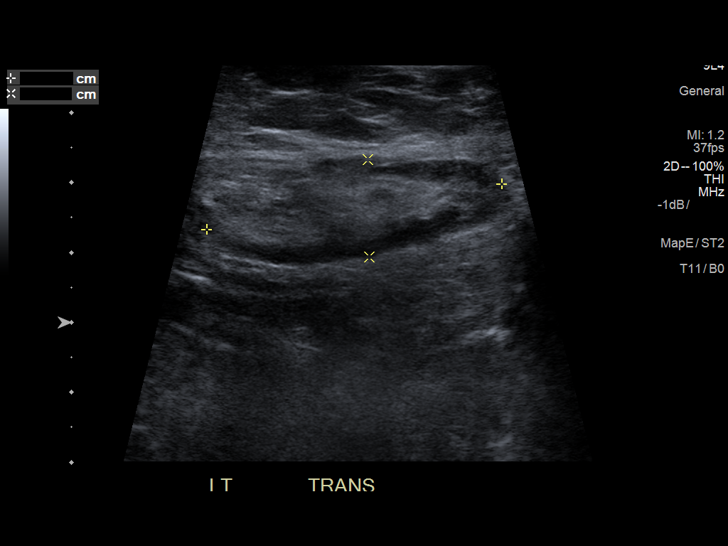
[im 40/40]
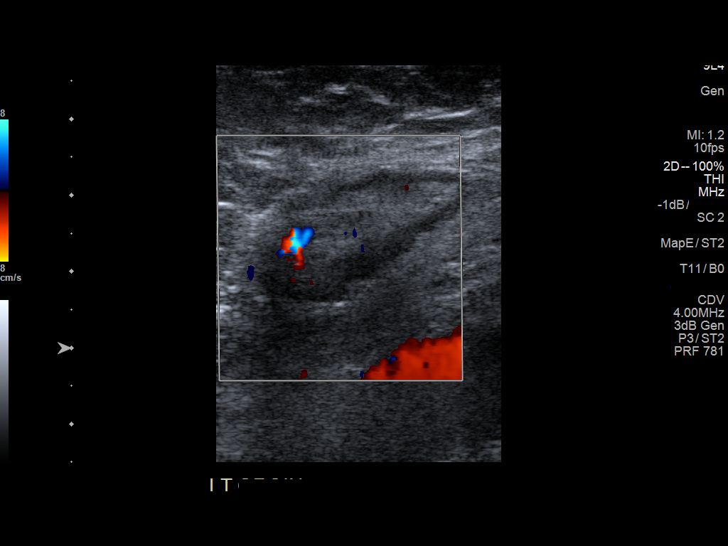

[13 of 24 positions shown; findings below may reference images not displayed]

FINDINGS: Contralateral Common Femoral Vein: Respiratory phasicity is normal
and symmetric with the symptomatic side. No evidence of thrombus.
Normal compressibility.

Common Femoral Vein: No evidence of thrombus. Normal
compressibility, respiratory phasicity and response to augmentation.

Saphenofemoral Junction: No evidence of thrombus. Normal
compressibility and flow on color Doppler imaging.

Profunda Femoral Vein: No evidence of thrombus. Normal
compressibility and flow on color Doppler imaging.

Femoral Vein: No evidence of thrombus. Normal compressibility,
respiratory phasicity and response to augmentation.

Popliteal Vein: No evidence of thrombus. Normal compressibility,
respiratory phasicity and response to augmentation.

Calf Veins: No evidence of thrombus. Normal compressibility and flow
on color Doppler imaging.

Superficial Great Saphenous Vein: No evidence of thrombus. Normal
compressibility.

Venous Reflux:  None.

Other Findings: No evidence of superficial thrombophlebitis or
abnormal fluid collection.
IMPRESSION: No evidence of left lower extremity deep venous thrombosis.

## 2020-07-06 ENCOUNTER — Encounter: Payer: Self-pay | Admitting: Emergency Medicine

## 2020-07-06 ENCOUNTER — Other Ambulatory Visit: Payer: Self-pay

## 2020-07-06 ENCOUNTER — Emergency Department
Admission: EM | Admit: 2020-07-06 | Discharge: 2020-07-06 | Disposition: A | Discharge status: Home / Self Care | Payer: BC Managed Care – PPO | Attending: Emergency Medicine | Admitting: Emergency Medicine

## 2020-07-06 DIAGNOSIS — L0291 Cutaneous abscess, unspecified: Secondary | ICD-10-CM

## 2020-07-06 DIAGNOSIS — L02214 Cutaneous abscess of groin: Secondary | ICD-10-CM | POA: Diagnosis not present

## 2020-07-06 DIAGNOSIS — F17290 Nicotine dependence, other tobacco product, uncomplicated: Secondary | ICD-10-CM | POA: Diagnosis not present

## 2020-07-06 DIAGNOSIS — Z79899 Other long term (current) drug therapy: Secondary | ICD-10-CM | POA: Diagnosis not present

## 2020-07-06 DIAGNOSIS — I10 Essential (primary) hypertension: Secondary | ICD-10-CM | POA: Diagnosis not present

## 2020-07-06 MED ORDER — CEPHALEXIN 500 MG PO CAPS: 1000.0000 mg | ORAL_CAPSULE | Freq: Two times a day (BID) | ORAL | 0 refills | Status: AC

## 2020-07-06 NOTE — ED Provider Notes (Signed)
[facility] Provider Note  ____________________________________________  Time seen: Approximately 2:12 PM  I have reviewed the triage vital signs and the nursing notes.   HISTORY  Chief Complaint Abscess    HPI Tyrone Patton is a 60 y.o. male who presents the emergency department concerned that he has an abscess that is now draining.  States that he noticed it a week ago, saw his primary care who placed him on Bactrim.  Area is now draining and patient states that because of the amount of drainage she was concerned that something may be wrong.  He is having no pain extending from the actual site which is in the left inguinal region.  No testicular pain.  No extension of pain, edema into the perineal region.  Patient has no abdominal pain.  No systemic complaints of fevers or chills.  He is taking the antibiotics as directed.       Past Medical History:  Diagnosis Date   Gout    Hypertension     There are no problems to display for this patient.   Past Surgical History:  Procedure Laterality Date   CARPAL TUNNEL RELEASE     SHOULDER SURGERY      Prior to Admission medications   Medication Sig Start Date End Date Taking? Authorizing Provider  cephALEXin (KEFLEX) 500 MG capsule Take 2 capsules (1,000 mg total) by mouth 2 (two) times daily. 07/06/20  Yes Emryn Flanery, Charline Bills, PA-C  allopurinol (ZYLOPRIM) 300 MG tablet Take 300 mg by mouth daily.    [provider]  atenolol (TENORMIN) 50 MG tablet Take 50 mg by mouth daily.    [provider]  colchicine 0.6 MG tablet Take 0.6 mg by mouth daily.    [provider]  doxycycline (VIBRAMYCIN) 100 MG capsule Take 100 mg by mouth 2 (two) times daily.    [provider]  esomeprazole (NEXIUM) 40 MG capsule Take 40 mg by mouth daily at 12 noon.    [provider]  losartan (COZAAR) 100 MG tablet Take 100 mg by mouth daily.    [provider]  naproxen (NAPROSYN) 500 MG tablet Take 500 mg by mouth 2 (two) times daily with a meal.    [provider]    Allergies Patient has no known allergies.  History reviewed. No pertinent family history.  Social History Social History   Tobacco Use   Smoking status: Every Day    Pack years: 0.00    Types: Cigars   Smokeless tobacco: Never  Substance Use Topics   Alcohol use: Yes    Alcohol/week: 10.0 standard drinks    Types: 5 Cans of beer, 5 Shots of liquor per week    Comment: daily   Drug use: No     Review of Systems  Constitutional: No fever/chills Eyes: No visual changes. No discharge ENT: No upper respiratory complaints. Cardiovascular: no chest pain. Respiratory: no cough. No SOB. Gastrointestinal: No abdominal pain.  No nausea, no vomiting.  No diarrhea.  No constipation. Genitourinary: Negative for dysuria. No hematuria Musculoskeletal: Negative for musculoskeletal pain. Skin: Negative for rash, abrasions, lacerations, ecchymosis.  Positive for abscess in the left groin that is now draining Neurological: Negative for headaches, focal weakness or numbness.  10 System ROS otherwise negative.  ____________________________________________   PHYSICAL EXAM:  VITAL SIGNS: ED Triage Vitals  Enc Vitals Group     BP 07/06/20 1226 123/83     Pulse Rate 07/06/20 1226  72     Resp 07/06/20 1226 20     Temp 07/06/20 1226 98.6 F (37 C)     Temp Source 07/06/20 1226 Oral     SpO2 07/06/20 1226 100 %     Weight 07/06/20 1227 125 lb (56.7 kg)     Height 07/06/20 1227 5\' 7"  (1.702 m)     Head Circumference --      Peak Flow --      Pain Score 07/06/20 1227 8     Pain Loc --      Pain Edu? --      Excl. in [place]? --      Constitutional: Alert and oriented. Well appearing and in no acute distress. Eyes: Conjunctivae are normal. PERRL. EOMI. Head: Atraumatic. ENT:      Ears:       Nose: No congestion/rhinnorhea.      Mouth/Throat: Mucous  membranes are moist.  Neck: No stridor.    Cardiovascular: Normal rate, regular rhythm. Normal S1 and S2.  Good peripheral circulation. Respiratory: Normal respiratory effort without tachypnea or retractions. Lungs CTAB. Good air entry to the bases with no decreased or absent breath sounds. Gastrointestinal: Bowel sounds 4 quadrants. Soft and nontender to palpation. No guarding or rigidity. No palpable masses. No distention.  Musculoskeletal: Full range of motion to all extremities. No gross deformities appreciated. Neurologic:  Normal speech and language. No gross focal neurologic deficits are appreciated.  Skin:  Skin is warm, dry and intact. No rash noted.  Visualization of the left inguinal region reveals a spontaneously draining abscess.  This is draining purulent material.  There is surrounding erythema and edema but there is no extension into the scrotum or perineal region.  Area is tender to palpation.  Again purulent drainage is spontaneously draining at this time. Psychiatric: Mood and affect are normal. Speech and behavior are normal. Patient exhibits appropriate insight and judgement.   ____________________________________________   LABS (all labs ordered are listed, but only abnormal results are displayed)  Labs Reviewed - No data to display ____________________________________________  EKG   ____________________________________________  RADIOLOGY   No results found.  ____________________________________________    PROCEDURES  Procedure(s) performed:    Procedures    Medications - No data to display   ____________________________________________   INITIAL IMPRESSION / ASSESSMENT AND PLAN / ED COURSE  Pertinent labs & imaging results that were available during my care of the patient were reviewed by me and considered in my medical decision making (see chart for details).  Review of the [place] CSRS was performed in accordance of the [place] prior to dispensing  any controlled drugs.           Patient's diagnosis is consistent with abscess.  Patient presented to the emergency department after having an abscess to the left groin opened and started to drain today.  Patient first noticed the symptoms a week ago, has been placed on antibiotics by his primary care.  This spontaneously draining at this time.  There is no extension into the scrotum or perineal region to be concern for Fournier's gangrene.  Again this area is spontaneously draining and patient is on antibiotics.  I will add a second antibiotic with Keflex.  Patient will continue the Bactrim.  He has prescribed pain medication from his primary care provider.  Patient is encouraged to use warm compresses to keep drainage ongoing.  Return precautions discussed with the patient to include increased pain, extension of erythema edema into the scrotum  or perineal region, any abdominal pain, fevers or chills.  Patient verbalizes understanding of same.  Otherwise follow-up primary care. Patient is given ED precautions to return to the ED for any worsening or new symptoms.     ____________________________________________  FINAL CLINICAL IMPRESSION(S) / ED DIAGNOSES  Final diagnoses:  Abscess      NEW MEDICATIONS STARTED DURING THIS VISIT:  ED Discharge Orders          Ordered    cephALEXin (KEFLEX) 500 MG capsule  2 times daily        07/06/20 1445                This chart was dictated using voice recognition software/Dragon. Despite best efforts to proofread, errors can occur which can change the meaning. Any change was purely unintentional.    Darletta Moll, PA-C 07/06/20 1445    Lucrezia Starch, MD 07/06/20 [phone]

## 2020-07-06 NOTE — ED Notes (Signed)
Abscess draining between left testicle and leg. "bursted" this morning per pt. Has had hx of same and is on antibiotics.

## 2020-07-06 NOTE — ED Triage Notes (Signed)
Pt via POV from home. Pt states he had boil on his L groin that he noticed it last Saturday. Pt was seen by his PCP and he was prescribed antibiotics, pt states it busted this AM and has been draining ever since. Pt is here because it won't stop draining. Pt is A&Ox4 and NAD.

## 2022-02-16 ENCOUNTER — Ambulatory Visit: Payer: BC Managed Care – PPO | Admitting: Anesthesiology

## 2022-02-16 ENCOUNTER — Encounter
Admission: RE | Disposition: A | Discharge status: Home / Self Care | Payer: Self-pay | Source: Home / Self Care | Attending: Gastroenterology

## 2022-02-16 ENCOUNTER — Ambulatory Visit
Admission: RE | Admit: 2022-02-16 | Discharge: 2022-02-16 | Disposition: A | Discharge status: Home / Self Care | Payer: BC Managed Care – PPO | Attending: Gastroenterology | Admitting: Gastroenterology

## 2022-02-16 ENCOUNTER — Encounter: Payer: Self-pay | Admitting: *Deleted

## 2022-02-16 DIAGNOSIS — Z1211 Encounter for screening for malignant neoplasm of colon: Secondary | ICD-10-CM | POA: Diagnosis present

## 2022-02-16 DIAGNOSIS — I10 Essential (primary) hypertension: Secondary | ICD-10-CM | POA: Insufficient documentation

## 2022-02-16 DIAGNOSIS — K5730 Diverticulosis of large intestine without perforation or abscess without bleeding: Secondary | ICD-10-CM | POA: Insufficient documentation

## 2022-02-16 DIAGNOSIS — Z6841 Body Mass Index (BMI) 40.0 and over, adult: Secondary | ICD-10-CM | POA: Diagnosis not present

## 2022-02-16 DIAGNOSIS — G4730 Sleep apnea, unspecified: Secondary | ICD-10-CM | POA: Insufficient documentation

## 2022-02-16 DIAGNOSIS — D125 Benign neoplasm of sigmoid colon: Secondary | ICD-10-CM | POA: Diagnosis not present

## 2022-02-16 DIAGNOSIS — M109 Gout, unspecified: Secondary | ICD-10-CM | POA: Insufficient documentation

## 2022-02-16 DIAGNOSIS — K640 First degree hemorrhoids: Secondary | ICD-10-CM | POA: Diagnosis not present

## 2022-02-16 DIAGNOSIS — F17210 Nicotine dependence, cigarettes, uncomplicated: Secondary | ICD-10-CM | POA: Diagnosis not present

## 2022-02-16 DIAGNOSIS — K648 Other hemorrhoids: Secondary | ICD-10-CM | POA: Diagnosis not present

## 2022-02-16 HISTORY — PX: COLONOSCOPY WITH PROPOFOL: SHX5780

## 2022-02-16 SURGERY — COLONOSCOPY WITH PROPOFOL
Anesthesia: General

## 2022-02-16 MED ORDER — SODIUM CHLORIDE 0.9 % IV SOLN
INTRAVENOUS | Status: DC
  Administered 2022-02-16: 1000 mL via INTRAVENOUS

## 2022-02-16 MED ORDER — PROPOFOL 500 MG/50ML IV EMUL
INTRAVENOUS | Status: DC | PRN
  Administered 2022-02-16: 100 ug/kg/min via INTRAVENOUS

## 2022-02-16 MED ORDER — MIDAZOLAM HCL 2 MG/2ML IJ SOLN
INTRAMUSCULAR | Status: AC
  Filled 2022-02-16: qty 2

## 2022-02-16 MED ORDER — GLYCOPYRROLATE 0.2 MG/ML IJ SOLN
INTRAMUSCULAR | Status: DC | PRN
  Administered 2022-02-16: .2 mg via INTRAVENOUS

## 2022-02-16 MED ORDER — MIDAZOLAM HCL 2 MG/2ML IJ SOLN
INTRAMUSCULAR | Status: DC | PRN
  Administered 2022-02-16: 2 mg via INTRAVENOUS

## 2022-02-16 NOTE — Anesthesia Preprocedure Evaluation (Addendum)
Anesthesia Evaluation  Patient identified by MRN, date of birth, ID band Patient awake    Reviewed: Allergy & Precautions, H&P , NPO status , Patient's Chart, lab work & pertinent test results, reviewed documented beta blocker date and time   Airway Mallampati: II   Neck ROM: full    Dental  (+) Poor Dentition   Pulmonary sleep apnea and Continuous Positive Airway Pressure Ventilation , Current Smoker and Patient abstained from smoking.   Pulmonary exam normal        Cardiovascular Exercise Tolerance: Poor hypertension, On Medications negative cardio ROS Normal cardiovascular exam Rhythm:regular Rate:Normal     Neuro/Psych negative neurological ROS  negative psych ROS   GI/Hepatic negative GI ROS, Neg liver ROS,,,  Endo/Other    Morbid obesity  Renal/GU negative Renal ROS  negative genitourinary   Musculoskeletal   Abdominal   Peds  Hematology negative hematology ROS (+)   Anesthesia Other Findings Past Medical History: No date: Gout No date: Hypertension Past Surgical History: No date: CARPAL TUNNEL RELEASE No date: SHOULDER SURGERY BMI    Body Mass Index: 48.00 kg/m     Reproductive/Obstetrics negative OB ROS                             Anesthesia Physical Anesthesia Plan  ASA: 3  Anesthesia Plan: General   Post-op Pain Management:    Induction:   PONV Risk Score and Plan:   Airway Management Planned:   Additional Equipment:   Intra-op Plan:   Post-operative Plan:   Informed Consent: I have reviewed the patients History and Physical, chart, labs and discussed the procedure including the risks, benefits and alternatives for the proposed anesthesia with the patient or authorized representative who has indicated his/her understanding and acceptance.     Dental Advisory Given  Plan Discussed with: CRNA  Anesthesia Plan Comments:        Anesthesia Quick  Evaluation

## 2022-02-16 NOTE — Interval H&P Note (Signed)
History and Physical Interval Note:  02/16/2022 12:25 PM  Tyrone Patton  has presented today for surgery, with the diagnosis of colon cancer screening.  The various methods of treatment have been discussed with the patient and family. After consideration of risks, benefits and other options for treatment, the patient has consented to  Procedure(s): COLONOSCOPY WITH PROPOFOL (N/A) as a surgical intervention.  The patient's history has been reviewed, patient examined, no change in status, stable for surgery.  I have reviewed the patient's chart and labs.  Questions were answered to the patient's satisfaction.     Lesly Rubenstein  Ok to proceed with colonoscopy

## 2022-02-16 NOTE — Op Note (Signed)
[facility] Patient Name: Login Muckleroy Procedure Date: 02/16/2022 11:44 AM MRN: [id number] Account #: [id number] Date of Birth: [birth year] Admit Type: Outpatient Age: 62 Room: [facility] ENDO ROOM 3 Gender: Male Note Status: Finalized Instrument Name: Colonoscope [id number] Procedure:             Colonoscopy Indications:           Screening for colorectal malignant neoplasm Providers:             Andrey Farmer MD, MD Referring MD:          Youlanda Roys. Lovie Macadamia, MD (Referring MD) Medicines:             Monitored Anesthesia Care Complications:         No immediate complications. Estimated blood loss:                         Minimal. Procedure:             Pre-Anesthesia Assessment:                        - Prior to the procedure, a History and Physical was                         performed, and patient medications and allergies were                         reviewed. The patient is competent. The risks and                         benefits of the procedure and the sedation options and                         risks were discussed with the patient. All questions                         were answered and informed consent was obtained.                         Patient identification and proposed procedure were                         verified by the physician, the nurse, the                         anesthesiologist, the anesthetist and the technician                         in the endoscopy suite. Mental Status Examination:                         alert and oriented. Airway Examination: normal                         oropharyngeal airway and neck mobility. Respiratory                         Examination: clear to auscultation. CV Examination:  normal. Prophylactic Antibiotics: The patient does not                         require prophylactic antibiotics. Prior                         Anticoagulants: The patient has taken no anticoagulant                          or antiplatelet agents. ASA Grade Assessment: III - A                         patient with severe systemic disease. After reviewing                         the risks and benefits, the patient was deemed in                         satisfactory condition to undergo the procedure. The                         anesthesia plan was to use monitored anesthesia care                         (MAC). Immediately prior to administration of                         medications, the patient was re-assessed for adequacy                         to receive sedatives. The heart rate, respiratory                         rate, oxygen saturations, blood pressure, adequacy of                         pulmonary ventilation, and response to care were                         monitored throughout the procedure. The physical                         status of the patient was re-assessed after the                         procedure.                        After obtaining informed consent, the colonoscope was                         passed under direct vision. Throughout the procedure,                         the patient's blood pressure, pulse, and oxygen                         saturations were monitored continuously. The  Colonoscope was introduced through the anus and                         advanced to the the cecum, identified by appendiceal                         orifice and ileocecal valve. The colonoscopy was                         somewhat difficult due to the patient's oxygen                         desaturation. Successful completion of the procedure                         was aided by managing the patient's medical                         instability. The patient tolerated the procedure well.                         The quality of the bowel preparation was good. The                         ileocecal valve, appendiceal orifice, and rectum were                          photographed. Findings:      The perianal and digital rectal examinations were normal.      A 2 mm polyp was found in the sigmoid colon. The polyp was sessile. The       polyp was removed with a jumbo cold forceps. Resection and retrieval       were complete. Estimated blood loss was minimal.      Multiple small-mouthed diverticula were found in the sigmoid colon and       descending colon.      Internal hemorrhoids were found during retroflexion. The hemorrhoids       were Grade I (internal hemorrhoids that do not prolapse).      The exam was otherwise without abnormality on direct and retroflexion       views. Impression:            - One 2 mm polyp in the sigmoid colon, removed with a                         jumbo cold forceps. Resected and retrieved.                        - Diverticulosis in the sigmoid colon and in the                         descending colon.                        - Internal hemorrhoids.                        - The examination was otherwise normal on direct and  retroflexion views. Recommendation:        - Discharge patient to home.                        - Resume previous diet.                        - Continue present medications.                        - Await pathology results.                        - Repeat colonoscopy in 5 years for surveillance.                        - Return to referring physician as previously                         scheduled. Procedure Code(s):     --- Professional ---                        [phone], Colonoscopy, flexible; with biopsy, single or                         multiple Diagnosis Code(s):     --- Professional ---                        Z12.11, Encounter for screening for malignant neoplasm                         of colon                        D12.5, Benign neoplasm of sigmoid colon                        K64.0, First degree hemorrhoids                        K57.30, Diverticulosis of large  intestine without                         perforation or abscess without bleeding CPT copyright 2022 American Medical Association. All rights reserved. The codes documented in this report are preliminary and upon coder review may  be revised to meet current compliance requirements. Andrey Farmer MD, MD 02/16/2022 1:08:49 PM Number of Addenda: 0 Note Initiated On: 02/16/2022 11:44 AM Scope Withdrawal Time: 0 hours 5 minutes 24 seconds  Total Procedure Duration: 0 hours 11 minutes 12 seconds  Estimated Blood Loss:  Estimated blood loss was minimal.      [facility]

## 2022-02-16 NOTE — Transfer of Care (Signed)
Immediate Anesthesia Transfer of Care Note  Patient: Aayush Gelpi  Procedure(s) Performed: COLONOSCOPY WITH PROPOFOL  Patient Location: PACU  Anesthesia Type:General  Level of Consciousness: awake, alert , and oriented  Airway & Oxygen Therapy: Patient Spontanous Breathing and Patient connected to face mask oxygen  Post-op Assessment: Report given to RN, Post -op Vital signs reviewed and stable, and Patient moving all extremities  Post vital signs: Reviewed and stable  Last Vitals:  Vitals Value Taken Time  BP 135/78 02/16/22 1307  Temp    Pulse 76 02/16/22 1309  Resp 12 02/16/22 1309  SpO2 100 % 02/16/22 1309  Vitals shown include unvalidated device data.  Last Pain:  Vitals:   02/16/22 1149  TempSrc: Temporal  PainSc: 0-No pain         Complications: No notable events documented.

## 2022-02-16 NOTE — H&P (Signed)
Outpatient short stay form Pre-procedure 02/16/2022  Lesly Rubenstein, MD  Primary Physician: Juluis Pitch, MD  Reason for visit:  Screening  History of present illness:    62 y/o gentleman with history of obesity, hypertension, and gout here for screening colonoscopy. Had normal colonoscopy over ten year ago. No blood thinners. No family history of GI malignancies. No significant abdominal surgeries.    Current Facility-Administered Medications:    0.9 %  sodium chloride infusion, , Intravenous, Continuous, Ladajah Soltys, Hilton Cork, MD, Last Rate: 20 mL/hr at 02/16/22 1206, 1,000 mL at 02/16/22 1206  Medications Prior to Admission  Medication Sig Dispense Refill Last Dose   allopurinol (ZYLOPRIM) 300 MG tablet Take 300 mg by mouth daily.   02/16/2022 at 0600   atenolol (TENORMIN) 50 MG tablet Take 50 mg by mouth daily.   02/16/2022 at 0600   colchicine 0.6 MG tablet Take 0.6 mg by mouth daily.   Past Month   losartan (COZAAR) 100 MG tablet Take 100 mg by mouth daily.   02/16/2022   cephALEXin (KEFLEX) 500 MG capsule Take 2 capsules (1,000 mg total) by mouth 2 (two) times daily. (Patient not taking: Reported on 02/16/2022) 28 capsule 0 Completed Course   doxycycline (VIBRAMYCIN) 100 MG capsule Take 100 mg by mouth 2 (two) times daily. (Patient not taking: Reported on 02/16/2022)   Not Taking   esomeprazole (NEXIUM) 40 MG capsule Take 40 mg by mouth daily at 12 noon. (Patient not taking: Reported on 02/16/2022)   Not Taking at 0600   naproxen (NAPROSYN) 500 MG tablet Take 500 mg by mouth 2 (two) times daily with a meal. (Patient not taking: Reported on 02/16/2022)   Not Taking     No Known Allergies   Past Medical History:  Diagnosis Date   Gout    Hypertension     Review of systems:  Otherwise negative.    Physical Exam  Gen: Alert, oriented. Appears stated age.  HEENT: PERRLA. Lungs: No respiratory distress CV: RRR Abd: soft, benign, no masses Ext: No edema    Planned  procedures: Proceed with colonoscopy. The patient understands the nature of the planned procedure, indications, risks, alternatives and potential complications including but not limited to bleeding, infection, perforation, damage to internal organs and possible oversedation/side effects from anesthesia. The patient agrees and gives consent to proceed.  Please refer to procedure notes for findings, recommendations and patient disposition/instructions.     Lesly Rubenstein, MD [facility]

## 2022-02-17 ENCOUNTER — Encounter: Payer: Self-pay | Admitting: Gastroenterology

## 2022-02-17 LAB — SURGICAL PATHOLOGY

## 2022-02-17 NOTE — Anesthesia Postprocedure Evaluation (Signed)
Anesthesia Post Note  Patient: Tyrone Patton  Procedure(s) Performed: COLONOSCOPY WITH PROPOFOL  Patient location during evaluation: PACU Anesthesia Type: General Level of consciousness: awake and alert Pain management: pain level controlled Vital Signs Assessment: post-procedure vital signs reviewed and stable Respiratory status: spontaneous breathing, nonlabored ventilation, respiratory function stable and patient connected to nasal cannula oxygen Cardiovascular status: blood pressure returned to baseline and stable Postop Assessment: no apparent nausea or vomiting Anesthetic complications: no   No notable events documented.   Last Vitals:  Vitals:   02/16/22 1317 02/16/22 1334  BP: 135/83 129/79  Pulse: (!) 58 (!) 53  Resp: 20 15  Temp:    SpO2: 98% 100%    Last Pain:  Vitals:   02/17/22 0744  TempSrc:   PainSc: 0-No pain                 Molli Barrows

## 2022-08-20 ENCOUNTER — Encounter: Payer: BC Managed Care – PPO | Attending: Family Medicine | Admitting: Dietician

## 2022-08-20 ENCOUNTER — Encounter: Payer: Self-pay | Admitting: Dietician

## 2022-08-20 VITALS — Ht 68.0 in | Wt 330.0 lb

## 2022-08-20 DIAGNOSIS — Z6841 Body Mass Index (BMI) 40.0 and over, adult: Secondary | ICD-10-CM | POA: Diagnosis not present

## 2022-08-20 NOTE — Patient Instructions (Signed)
Continue to decrease sugar sweetened sodas and sweet tea.  Great job making healthy changes, continue to eat mostly chicken, [place], fish, or shrimp and leave beef and pork products for occasional use.  Keep up the veggies and fruits for low calorie options and help with blood pressure, cholesterol, and diabetes prevention. Plan to eat a light meal or snack in the morning during work. A protein bar or shake is fine, or english muffin sandwich or fruit and yogurt Use opportunities during the work day to walk and/or stretch. Stay active on days off.

## 2022-08-20 NOTE — Progress Notes (Signed)
Medical Nutrition Therapy: Visit start time: 0930  end time: 1030  Assessment:   Referral Diagnosis: obesity Other medical history/ diagnoses: HTN, gout Psychosocial issues/ stress concerns: none  Medications, supplements: reconciled list in medical record   Preferred learning method:  Auditory Visual Hands-on   Current weight: 330.0lbs Height: 5'8" BMI: 50.18 Patient's personal weight goal: 250lbs  Progress and evaluation:  Patient reports losing about 10lbs around 6 months ago with diet changes.  Reduced red meat, more poultry and fish, more veg, stopped eating sweets. Difficult to maintain. Works 2 jobs driving buses. Food allergies: none Special diet practices: none Patient seeks help with healthy food choices and meal planning for weight loss   Dietary Intake:  Usual eating pattern includes 2-3 meals and 1 snacks per day. Dining out frequency: 5-6 meals per week. Who plans meals/ buys groceries? Self, spouse Who prepares meals? Self, spouse  Starts work at 4:30am  Breakfast: biscuit from [employer] sometimes skips Snack: none Lunch: 10:30-11am -- usu out baked chicken or fish + veg (ie cafeteria) Snack: occasionally fruit Supper: 6-7pm light meal ie shrimp/ chicken/ vish + rice + veg spinach Snack: sometimes fruit such as grapes Beverages: soda, sweet tea, some water infused with cucumber or lemon  Physical activity: walking 15 minutes, 2x a week   Intervention:   Nutrition Care Education:   Basic nutrition: basic food groups; appropriate nutrient balance; appropriate meal and snack schedule; general nutrition guidelines    Weight control: importance of low sugar and low fat choices; portion control; estimated energy needs for weight loss at 1600 kcal, provided guidance for 45% CHO, 25% pro, 30% fat; choosing high fiber foods, increasing low carb veg and fruits; reducing sugar sweetened beverages; importance of physical activity  Other intervention  notes: Patient has been making healthy lifestyle changes and is motivated to continue. Established additional goals for change with direction from patient. Patient declined MNT follow up at this time but will schedule later if needed.   Nutritional Diagnosis:  [place]-3.3 Overweight/obesity As related to excess calories and inadequate physical activity.  As evidenced by patient with current BMI of 50, making lifestyle changes to promote weight loss.   Education Materials given:  Designer, industrial/product with food lists, sample meal pattern Sample menus Snacking handout Visit summary with goals/ instructions   Learner/ who was taught:  Patient   Level of understanding: Verbalizes/ demonstrates competency  Demonstrated degree of understanding via:   Teach back Learning barriers: None  Willingness to learn/ readiness for change: Eager, change in progress   Monitoring and Evaluation:  Dietary intake, exercise, and body weight      follow up: prn

## 2022-10-21 ENCOUNTER — Encounter: Payer: Self-pay | Admitting: Dietician

## 2022-10-21 ENCOUNTER — Encounter: Payer: BC Managed Care – PPO | Attending: Family Medicine | Admitting: Dietician

## 2022-10-21 VITALS — Wt 328.0 lb

## 2022-10-21 DIAGNOSIS — E66813 Obesity, class 3: Secondary | ICD-10-CM | POA: Diagnosis not present

## 2022-10-21 DIAGNOSIS — Z6841 Body Mass Index (BMI) 40.0 and over, adult: Secondary | ICD-10-CM | POA: Insufficient documentation

## 2022-10-21 NOTE — Progress Notes (Signed)
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 Walks when bus is stopped for several minutes or more.  Weight loss slow; patient is making changes; some changes have been more recent ie limiting bread, increase in walking    Dietary Intake:  Usual eating pattern includes 1-3 meals and 1-2 snacks per day. Dining out frequency: 2-4 meals per week.  Breakfast: maybe 2x a week egg mcmuffin Snack: same as pm if no breakfast Lunch: 10:30-11am baked chicken/ fish + veg; sometimes skips Snack: graham crackers with peanut butter; fruit orange/ apple/ grapes Supper: home from work 7-8pm 10/8 spaghetti small portion; today plans chicken baked with spinach no starch; limits bread Snack: none; goes to sleep about 1-2 hours after supper Beverages: water, sparkling water fruit beverage; tea rarely  Physical activity: walking weekends on track with friend   Intervention:   Nutrition Care Education:  Basic nutrition: reviewed appropriate nutrient balance; appropriate meal and snack schedule; general nutrition guidelines    Weight control: reviewed progress since previous visit; role of exercise and options for including regular exercise with long workdays Advanced nutrition: cooking techniques-- pre-prepping lunches and/or breakfast foods to avoid food prep on long workdays   Other Intervention Notes: Patient is making changes gradually, and is motivated to continue. Updated goals with input from patient.     Nutritional Diagnosis:  [place]-3.3 Overweight/obesity As related to history of excess calories and inadequate physical activity.  As  evidenced by patient with current BMI of 49.87, working on diet and lifestyle changes to promote weight loss.   Education Materials given:  Visit summary with goals/ instructions   Learner/ who was taught:  Patient   Level of understanding: Verbalizes/ demonstrates competency  Demonstrated degree of understanding via:   Teach back Learning barriers: None  Willingness to learn/ readiness for change: Eager, change in progress  Monitoring and Evaluation:  Dietary intake, exercise, and body weight      follow up:  01/20/23 at 4:45pm

## 2022-10-21 NOTE — Patient Instructions (Signed)
Keep looking for options to bring for at least a simple quick lunch -- make a sandwich on flat bread and freeze to keep for several days, or keep in fridge for 1-2 days. Goal is to have something to eat every 3-5 hours during the day. Boiled eggs with fruit or raw veggies and a few whole grain crackers or crispbread (like [place]) can be a simple but balanced lunch.  Try some oatmeal (low sugar) and add a few nuts. Or get plain oatmeal and add nuts and fruit.  Make sure to exercise consistently by walking and stretching during the workday and taking longer walks on weekends.

## 2023-01-20 ENCOUNTER — Ambulatory Visit: Payer: BC Managed Care – PPO | Admitting: Dietician

## 2023-01-21 ENCOUNTER — Encounter: Payer: 59 | Attending: Dietician | Admitting: Dietician

## 2023-01-21 DIAGNOSIS — Z6841 Body Mass Index (BMI) 40.0 and over, adult: Secondary | ICD-10-CM | POA: Diagnosis not present

## 2023-01-21 DIAGNOSIS — Z713 Dietary counseling and surveillance: Secondary | ICD-10-CM | POA: Insufficient documentation

## 2023-01-21 DIAGNOSIS — E66813 Obesity, class 3: Secondary | ICD-10-CM

## 2023-01-21 NOTE — Patient Instructions (Signed)
 Try low carb smoothie for one meal daily rather than skipping the meal Make sure to keep healthy, balanced snacks on hand during the day, especially if unable to stop for lunch. Keep up regular walking and gradually increase the time spent walking

## 2023-01-21 NOTE — Progress Notes (Signed)
 Medical Nutrition Therapy: Visit start time: 1120  end time: 1150      Visit conducted in video format per patient request Assessment:  Diagnosis: obesity Medical history changes: no changes Psychosocial issues/ stress concerns: none  Medications, supplement changes: no changes   Current weight: not checked; virtual visit Height: 5'8 BMI: 49.87 10/21/22  Progress and evaluation:  Patient reports some extra eating over the holidays; now getting back into healthier habits. Continues with 2 jobs, leading to some skipped meals. Continues to walk when able during breaks, weekends.    Dietary Intake:  Usual eating pattern includes 1-2 meals and 1-2 snacks per day. Dining out frequency: 2-4 meals per week.  Breakfast: Egg McMuffin; sometimes skips Snack: same as pm if no breakfast Lunch: meat + veg; sometimes skips Snack: graham crackers with peanut butter; boiled eggs; fruits Supper: chicken/ fish + veg, sometimes no starch; sometimes pasta Snack: none Beverages: water, sparkling water, rarely tea  Physical activity: walking during breaks at work, sometimes on weekends  Intervention:   Nutrition Care Education:  Basic nutrition: reviewed appropriate meal and snack schedule; general nutrition guidelines    Weight control: reviewed progress since previous visit Advanced nutrition:  recipe modification; cooking techniques Other:  meal and snack options to suit work schedule  Other Intervention Notes: Patient voices interest in trying homemade smoothie for breakfast or lunch; will email recipe template to patient Updated nutrition goals with input from patient. Scheduled another MNT follow up for 6.2025 per patient preference   Nutritional Diagnosis:  [place]-3.3 Overweight/obesity As related to history of excess calories and inadequate physical activity.  As evidenced by patient with current BMI of 49.87, working on diet and lifestyle changes to promote weight loss.    Education  Materials given:  Carb-Mindful Smoothie Recipe to be sent via email Goals/ instructions to be viewed via patient portal   Learner/ who was taught:  Patient   Level of understanding: Verbalizes/ demonstrates competency  Demonstrated degree of understanding via:   Teach back Learning barriers: None  Willingness to learn/ readiness for change: Eager, change in progress  Monitoring and Evaluation:  Dietary intake, exercise, and body weight      follow up:  06/28/23 at 4:15pm

## 2023-06-28 ENCOUNTER — Ambulatory Visit: Payer: 59 | Admitting: Dietician

## 2023-07-26 ENCOUNTER — Ambulatory Visit: Admitting: Dietician

## 2023-09-22 ENCOUNTER — Encounter: Payer: Self-pay | Admitting: Dietician

## 2023-09-22 NOTE — Progress Notes (Signed)
 MR. Senkbeil cancelled his appointment for 07/26/23, which had been rescheduled from 06/28/23. He has not rescheduled again. Sent notification to referring provider.
